# Patient Record
Sex: Female | Born: 1995 | Hispanic: Yes | Marital: Single | Smoking: Never smoker
Health system: Southern US, Community
[De-identification: ages and names within clinical notes are randomized; demographics above are authoritative.]

## PROBLEM LIST (undated history)

## (undated) DIAGNOSIS — J45909 Unspecified asthma, uncomplicated: Secondary | ICD-10-CM

## (undated) HISTORY — PX: WISDOM TOOTH EXTRACTION: SHX21

## (undated) HISTORY — PX: TONSILLECTOMY: SUR1361

---

## 2016-12-30 ENCOUNTER — Emergency Department (HOSPITAL_COMMUNITY): Payer: Federal, State, Local not specified - PPO

## 2016-12-30 ENCOUNTER — Encounter (HOSPITAL_COMMUNITY): Payer: Self-pay | Admitting: Emergency Medicine

## 2016-12-30 DIAGNOSIS — R079 Chest pain, unspecified: Secondary | ICD-10-CM | POA: Diagnosis present

## 2016-12-30 DIAGNOSIS — R0789 Other chest pain: Secondary | ICD-10-CM | POA: Insufficient documentation

## 2016-12-30 DIAGNOSIS — J45909 Unspecified asthma, uncomplicated: Secondary | ICD-10-CM | POA: Diagnosis not present

## 2016-12-30 DIAGNOSIS — R0602 Shortness of breath: Secondary | ICD-10-CM | POA: Diagnosis not present

## 2016-12-30 DIAGNOSIS — R0600 Dyspnea, unspecified: Secondary | ICD-10-CM | POA: Insufficient documentation

## 2016-12-30 LAB — D-DIMER, QUANTITATIVE (NOT AT ARMC)

## 2016-12-30 LAB — BASIC METABOLIC PANEL
ANION GAP: 11 (ref 5–15)
BUN: 12 mg/dL (ref 6–20)
CALCIUM: 9.3 mg/dL (ref 8.9–10.3)
CHLORIDE: 105 mmol/L (ref 101–111)
CO2: 23 mmol/L (ref 22–32)
CREATININE: 0.71 mg/dL (ref 0.44–1.00)
GFR calc Af Amer: >60 mL/min (ref >60)
GFR calc non Af Amer: >60 mL/min (ref >60)
Glucose, Bld: 93 mg/dL (ref 65–99)
Potassium: 3.3 mmol/L — ABNORMAL LOW (ref 3.5–5.1)
SODIUM: 139 mmol/L (ref 135–145)

## 2016-12-30 LAB — CBC
HCT: 35.5 % — ABNORMAL LOW (ref 36.0–46.0)
HEMOGLOBIN: 11.6 g/dL — AB (ref 12.0–15.0)
MCH: 28 pg (ref 26.0–34.0)
MCHC: 32.7 g/dL (ref 30.0–36.0)
MCV: 85.7 fL (ref 78.0–100.0)
PLATELETS: 307 10*3/uL (ref 150–400)
RBC: 4.14 MIL/uL (ref 3.87–5.11)
RDW: 14.1 % (ref 11.5–15.5)
WBC: 8.4 10*3/uL (ref 4.0–10.5)

## 2016-12-30 LAB — I-STAT TROPONIN, ED
TROPONIN I, POC: 0 ng/mL (ref 0.00–0.08)
Troponin i, poc: 0.01 ng/mL (ref 0.00–0.08)

## 2016-12-30 NOTE — ED Triage Notes (Signed)
Pt presents with midsternal CP x 2 days worse with activity; pt reports she is an active person who runs and works out daily however she cannot climb one flight of stairs d/t pain and sob; pt denies any hx of same

## 2016-12-31 ENCOUNTER — Emergency Department (HOSPITAL_COMMUNITY)
Admission: EM | Admit: 2016-12-31 | Discharge: 2016-12-31 | Disposition: A | Discharge status: Home / Self Care | Payer: Federal, State, Local not specified - PPO | Attending: Emergency Medicine | Admitting: Emergency Medicine

## 2016-12-31 DIAGNOSIS — R0600 Dyspnea, unspecified: Secondary | ICD-10-CM

## 2016-12-31 DIAGNOSIS — R079 Chest pain, unspecified: Secondary | ICD-10-CM

## 2016-12-31 HISTORY — DX: Unspecified asthma, uncomplicated: J45.909

## 2016-12-31 NOTE — ED Provider Notes (Signed)
[facility]-EMERGENCY DEPT Provider Note   CSN: [id number] Arrival date & time: 12/30/16  1907   By signing my name below, I, Clarisse GougeXavier Herndon, attest that this documentation has been prepared under the direction and in the presence of Azalia Bilisampos, Elfriede Bonini, MD. Electronically signed, Clarisse GougeXavier Herndon, ED Scribe. 12/31/16. 2:52 AM.   History   Chief Complaint Chief Complaint  Patient presents with  . Chest Pain  . Shortness of Breath   The history is provided by the patient and medical records. No language interpreter was used.    Julie Wilkins is a 21 y.o. female h/o asthma on record presenting to the Emergency Department concerning fluctuating chest pain x 4 days. Pt states 4 days ago she began to experience chest pain that she describes as "a hiccup that wont come out", stating she had to take deep breaths in order to soothe this sensation. The following day, the pt worked 15 hours and experienced very mild SOB intermittently, but felt fine otherwise. Yesterday, pt allegedly experienced gradually worsening chest pain throughout the day and SOB on exertion. She states she became winded walking up a flight of stairs, which is atypical for her because she is generally pretty active. She adds she moved from a 2nd floor apartment to a 4th floor apartment yesterday. She states she uses birth control. No h/o similar symptoms noted. No ETOH use. No Hx or FMHx of blood clots. No leg swelling or long distance travel noted.  Past Medical History:  Diagnosis Date  . Asthma     There are no active problems to display for this patient.   Past Surgical History:  Procedure Laterality Date  . TONSILLECTOMY    . WISDOM TOOTH EXTRACTION      OB History    No data available       Home Medications    Prior to Admission medications   Not on File    Family History History reviewed. No pertinent family history.  Social History Social History  Substance Use Topics  . Smoking status: Not on file  .  Smokeless tobacco: Never Used  . Alcohol use No     Allergies   Amoxicillin   Review of Systems Review of Systems  Constitutional: Negative for activity change and appetite change.  Respiratory: Positive for shortness of breath.   Cardiovascular: Positive for chest pain. Negative for leg swelling.  All other systems reviewed and are negative.    Physical Exam Updated Vital Signs BP 127/86   Pulse (!) 54   Temp 98.6 F (37 C) (Oral)   Resp 20   Ht 5\' 5"  (1.651 m)   Wt 130 lb (59 kg)   LMP 12/05/2016   SpO2 100%   BMI 21.63 kg/m   Physical Exam  Constitutional: She is oriented to person, place, and time. She appears well-developed and well-nourished. No distress.  HENT:  Head: Normocephalic and atraumatic.  Eyes: EOM are normal.  Neck: Normal range of motion.  Cardiovascular: Normal rate, regular rhythm and normal heart sounds.   Pulmonary/Chest: Effort normal and breath sounds normal.  Abdominal: Soft. She exhibits no distension. There is no tenderness.  Musculoskeletal: Normal range of motion. She exhibits no tenderness.  No calf tenderness  Neurological: She is alert and oriented to person, place, and time.  Skin: Skin is warm and dry.  Psychiatric: She has a normal mood and affect. Judgment normal.  Nursing note and vitals reviewed.    ED Treatments / Results  DIAGNOSTIC STUDIES: Oxygen  Saturation is 100% on RA, NL by my interpretation.    COORDINATION OF CARE: 2:48 AM-Discussed next steps with pt. Pt verbalized understanding and is agreeable with the plan. Will Rx medication and refer to cardiologist. Pt prepared for d/c, advised of symptomatic care at home, F/U instructions  and return precautions.    Labs (all labs ordered are listed, but only abnormal results are displayed) Labs Reviewed  BASIC METABOLIC PANEL - Abnormal; Notable for the following:       Result Value   Potassium 3.3 (*)    All other components within normal limits  CBC - Abnormal;  Notable for the following:    Hemoglobin 11.6 (*)    HCT 35.5 (*)    All other components within normal limits  D-DIMER, QUANTITATIVE (NOT AT [facility])  I-STAT TROPOININ, ED  I-STAT TROPOININ, ED    EKG  EKG Interpretation  Date/Time:  Friday December 30 2016 19:19:15 EDT Ventricular Rate:  90 PR Interval:  164 QRS Duration: 98 QT Interval:  382 QTC Calculation: 467 R Axis:   70 Text Interpretation:  Normal sinus rhythm with sinus arrhythmia Incomplete right bundle branch block Borderline ECG No old tracing to compare Confirmed by Azalia Bilis ([id number]) on 12/31/2016 2:41:50 AM       Radiology Dg Chest 2 View  Result Date: 12/30/2016 CLINICAL DATA:  Chest pain since 12/28/2016, worse today. EXAM: CHEST  2 VIEW COMPARISON:  None. FINDINGS: Lungs are clear. Heart size is normal. No pneumothorax or pleural effusion. No bony abnormality. IMPRESSION: Negative chest. Electronically Signed   By: Drusilla Kanner M.D.   On: 12/30/2016 19:50    Procedures Procedures (including critical care time)  Medications Ordered in ED Medications - No data to display   Initial Impression / Assessment and Plan / ED Course  I have reviewed the triage vital signs and the nursing notes.  Pertinent labs & imaging results that were available during my care of the patient were reviewed by me and considered in my medical decision making (see chart for details).     Patient overall well-appearing.  No increased work of breathing.  I personally walked around the emergency department and do breast pain.  She had no increased work of breathing.  Her heart rate remained normal.  She had no hypoxia.  Bowel signs stable.  D-dimer negative.  Hemoglobin stable.  Chest x-ray clear.  Outpatient pulmonary follow-up.  Patient understands return to the ER for new or worsening symptoms  Final Clinical Impressions(s) / ED Diagnoses   Final diagnoses:  Chest pain, unspecified type  Dyspnea, unspecified type    New  Prescriptions There are no discharge medications for this patient.  I personally performed the services described in this documentation, which was scribed in my presence. The recorded information has been reviewed and is accurate.        Azalia Bilis, MD 12/31/16 [phone]

## 2017-01-04 ENCOUNTER — Encounter: Payer: Self-pay | Admitting: Cardiology

## 2017-01-04 ENCOUNTER — Ambulatory Visit (INDEPENDENT_AMBULATORY_CARE_PROVIDER_SITE_OTHER): Payer: Federal, State, Local not specified - PPO | Admitting: Cardiology

## 2017-01-04 DIAGNOSIS — R0602 Shortness of breath: Secondary | ICD-10-CM

## 2017-01-04 DIAGNOSIS — R079 Chest pain, unspecified: Secondary | ICD-10-CM

## 2017-01-04 DIAGNOSIS — R002 Palpitations: Secondary | ICD-10-CM

## 2017-01-04 NOTE — Patient Instructions (Signed)
Medication Instructions:  Your physician recommends that you continue on your current medications as directed. Please refer to the Current Medication list given to you today.   Labwork: TODAY: BMET  Testing/Procedures: Your physician has requested that you have an echocardiogram. Echocardiography is a painless test that uses sound waves to create images of your heart. It provides your doctor with information about the size and shape of your heart and how well your heart's chambers and valves are working. This procedure takes approximately one hour. There are no restrictions for this procedure.   Your physician has requested that you have an exercise tolerance test. For further information please visit [email or web address]. Please also follow instruction sheet, as given.  Your physician has recommended that you wear an event monitor. Event monitors are medical devices that record the heart's electrical activity. Doctors most often us these monitors to diagnose arrhythmias. Arrhythmias are problems with the speed or rhythm of the heartbeat. The monitor is a small, portable device. You can wear one while you do your normal daily activities. This is usually used to diagnose what is causing palpitations/syncope (passing out).  Follow-Up: Your physician recommends that you schedule a follow-up appointment AS NEEDED with Dr. Mayford Knifeurner pending study results.  Any Other Special Instructions Will Be Listed Below (If Applicable).     If you need a refill on your cardiac medications before your next appointment, please call your pharmacy.

## 2017-01-04 NOTE — Progress Notes (Signed)
Cardiology Office Note    Date:  01/04/2017   ID:  Julie Wilkins, DOB 31-May-1996, MRN [id number]  PCP:  System, Pcp Not In  Cardiologist:  Armanda Magic, MD   Chief Complaint  Patient presents with  . Chest Pain  . Shortness of Breath    History of Present Illness:  Julie Wilkins is a 21 y.o. female who is being seen today for the evaluation of chest pain and SOB at the request of Azalia Bilis, MD.  This is a 21yo female with a history of asthma who was seen in the ER a few days ago complaining of CP and SOB.  She had been having intermittent CP for 4 days prior.  She says that she had worked out that day and later had some dizziness and a  Headache.  The next day during her workout she had chest discomfort and resolved after her workout was over.  She described it as a "hiccup" that would not come out and when she took deep breaths it made it better. She thinks it may have been palpitations.   The day after the chest discomfort started she had  SOB off and on going up and down the stairs.  The next day her chest discomfort worsened as well as the SOB and would get winded just going up 1 flight of stairs.  She is not sure if her discomfort is more the heart fluttering or a pain.  She denied any recent long distance travel.  Workup included a K+ of 3.3, Hgb 11.6 and EKG with IRBBB.  D-Dimer was negative and chest xray was normal.  She has not done any activity since Friday. She says that when she was in the ER she was noted to have the same feeling in her Chest and PVCs were noted on the tele.    Past Medical History:  Diagnosis Date  . Asthma     Past Surgical History:  Procedure Laterality Date  . TONSILLECTOMY    . WISDOM TOOTH EXTRACTION      Current Medications: Current Meds  Medication Sig  . TRINESSA, 28, 0.18/0.215/0.25 MG-35 MCG tablet Take by mouth daily.    Allergies:   Amoxicillin   Social History   Social History  . Marital status: Single    Spouse name: N/A    . Number of children: N/A  . Years of education: N/A   Social History Main Topics  . Smoking status: Never Smoker  . Smokeless tobacco: Never Used  . Alcohol use No  . Drug use: No  . Sexual activity: Not Asked   Other Topics Concern  . None   Social History Narrative  . None     Family History:  The patient's family history includes Diabetes in her maternal grandmother and maternal uncle.   ROS:   Please see the history of present illness.    ROS All other systems reviewed and are negative.  No flowsheet data found.     PHYSICAL EXAM:   VS:  BP 100/70   Pulse 64   Ht 5\' 5"  (1.651 m)   Wt 136 lb (61.7 kg)   LMP 12/05/2016   SpO2 98%   BMI 22.63 kg/m    GEN: Well nourished, well developed, in no acute distress  HEENT: normal  Neck: no JVD, carotid bruits, or masses Cardiac: RRR; no murmurs, rubs, or gallops,no edema.  Intact distal pulses bilaterally.  Respiratory:  clear to auscultation bilaterally, normal work of  breathing GI: soft, nontender, nondistended, + BS MS: no deformity or atrophy  Skin: warm and dry, no rash Neuro:  Alert and Oriented x 3, Strength and sensation are intact Psych: euthymic mood, full affect  Wt Readings from Last 3 Encounters:  01/04/17 136 lb (61.7 kg)  12/30/16 130 lb (59 kg)      Studies/Labs Reviewed:   EKG:  EKG is not ordered today.  The ekg ordered in EF demonstrates   Recent Labs: 12/30/2016: BUN 12; Creatinine, Ser 0.71; Hemoglobin 11.6; Platelets 307; Potassium 3.3; Sodium 139   Lipid Panel No results found for: CHOL, TRIG, HDL, CHOLHDL, VLDL, LDLCALC, LDLDIRECT  Additional studies/ records that were reviewed today include:  ER records    ASSESSMENT:    1. Chest pain, unspecified type   2. SOB (shortness of breath)   3. Heart palpitations      PLAN:  In order of problems listed above:  1. Chest pain - this is atypical and may be related to asthma.  She does not have any CRFs and very young so  coronary ischemia unlikely ulness she has an anomalous coronary artery.  I will get an ETT to rule out ischemia.   2.  SOB with a history of asthma.  This is likely related to her underlying asthma but she has an IRBBB noted on EKG that can be seen with ASD.  I do not hear a murmur.  I will get a 2D echo to rule out structural heart disease. Chest xray and D-Dimer were negative.  If stress test and echo are normal she needs to see Pulmonary.   3.  ? Palpitations - some of her symptoms sound like she may be having symptomatic PVCs. Her potassium was low at the time at 3.3.  She did not get any potassium in the ER so I will check another BMET today    Medication Adjustments/Labs and Tests Ordered: Current medicines are reviewed at length with the patient today.  Concerns regarding medicines are outlined above.  Medication changes, Labs and Tests ordered today are listed in the Patient Instructions below.  There are no Patient Instructions on file for this visit.   Signed, Armanda Magicraci Kevonta Phariss, MD  01/04/2017 2:08 PM    [facility] [address], [place], [place]  [address] Phone: [phone]; Fax: [phone]

## 2017-01-05 LAB — BASIC METABOLIC PANEL
BUN / CREAT RATIO: 18 (ref 9–23)
BUN: 13 mg/dL (ref 6–20)
CHLORIDE: 103 mmol/L (ref 96–106)
CO2: 23 mmol/L (ref 20–29)
CREATININE: 0.71 mg/dL (ref 0.57–1.00)
Calcium: 9.6 mg/dL (ref 8.7–10.2)
GFR calc Af Amer: 139 mL/min/{1.73_m2} (ref >59)
GFR calc non Af Amer: 120 mL/min/{1.73_m2} (ref >59)
GLUCOSE: 98 mg/dL (ref 65–99)
POTASSIUM: 4 mmol/L (ref 3.5–5.2)
SODIUM: 141 mmol/L (ref 134–144)

## 2017-01-24 ENCOUNTER — Other Ambulatory Visit (HOSPITAL_COMMUNITY): Payer: Federal, State, Local not specified - PPO

## 2018-03-31 IMAGING — DX DG CHEST 2V
2 series · 2 of 2 positions shown · non-contrast
Comparison: None.

CLINICAL DATA: Chest pain since 12/28/2016, worse today.

EXAM:
CHEST  2 VIEW

[chest pa]
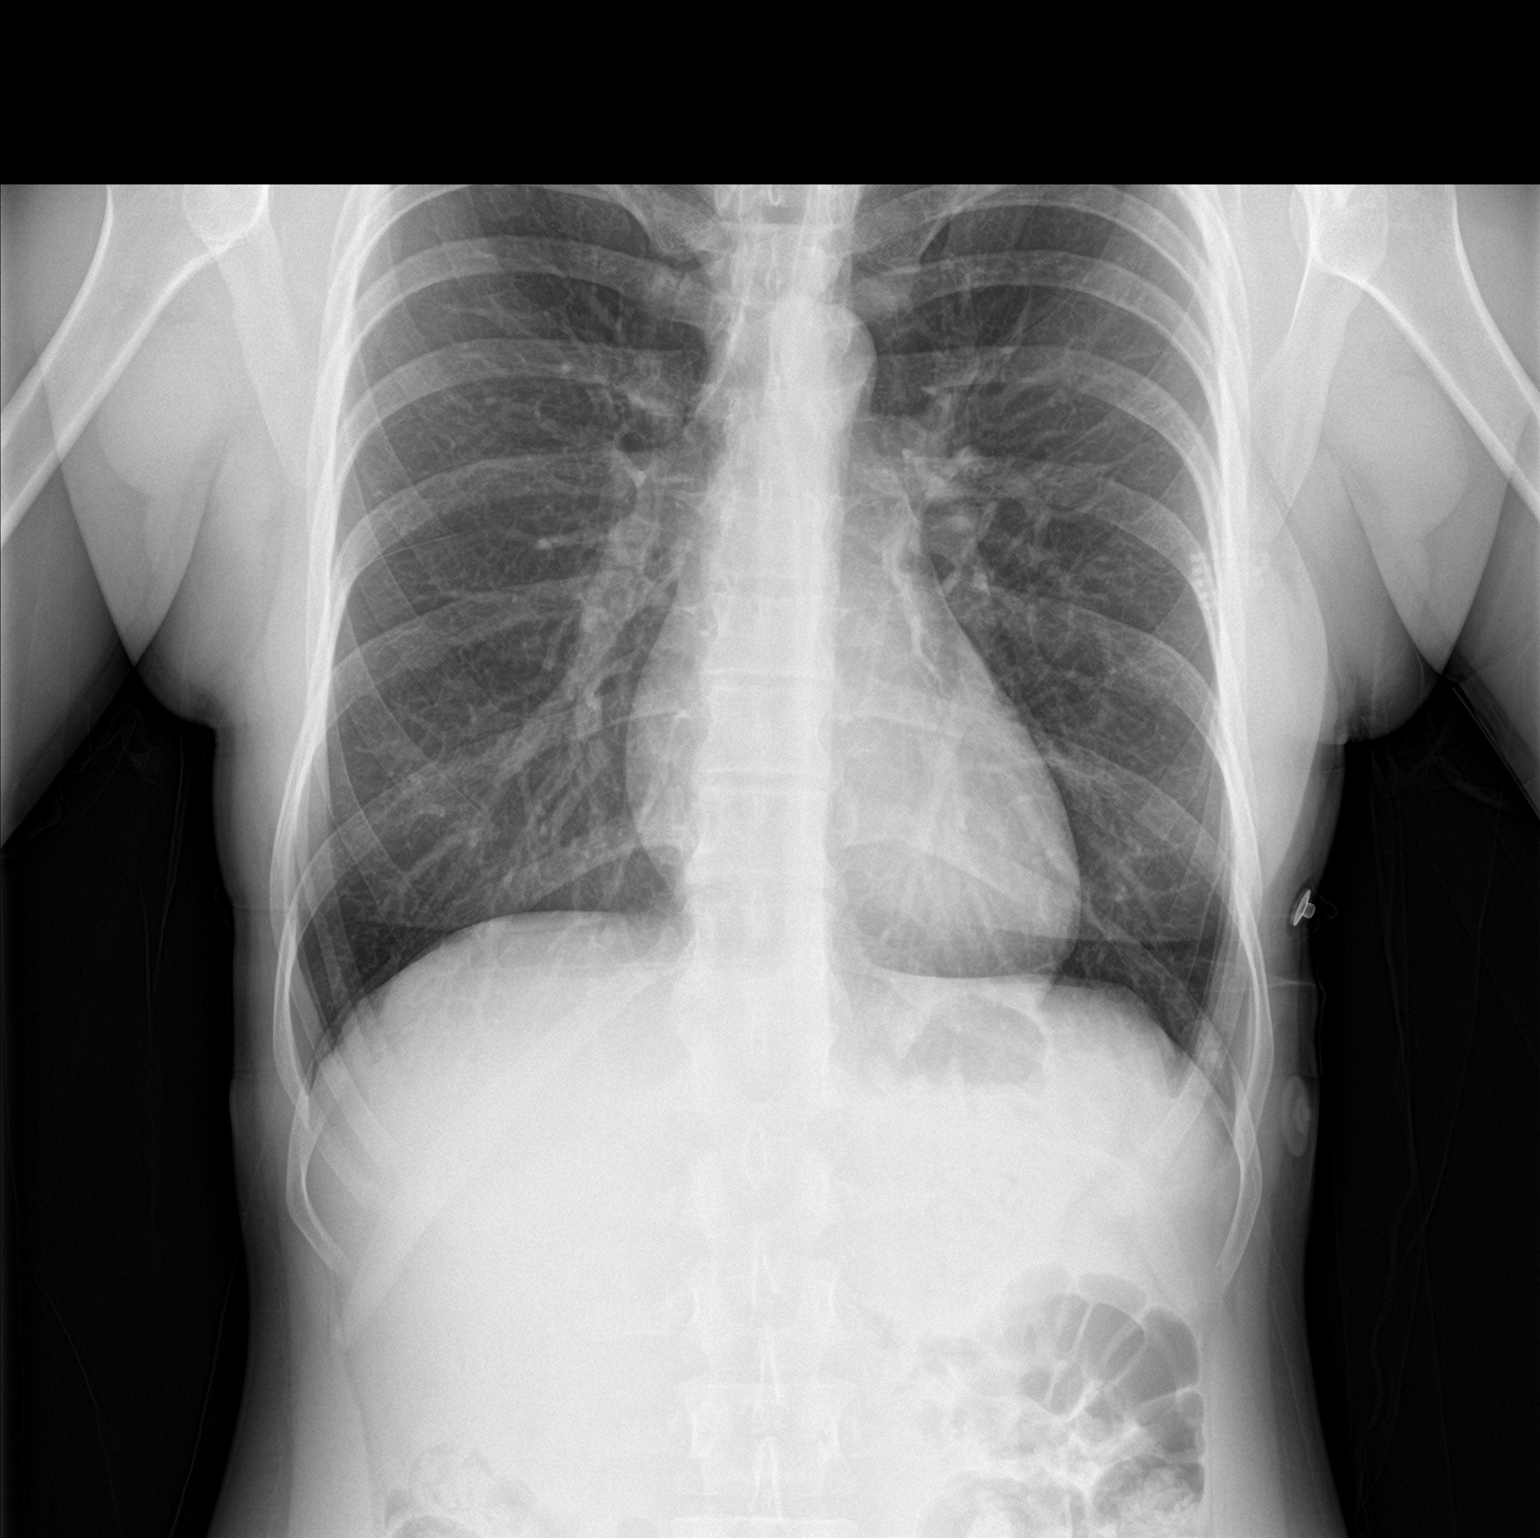

[chest lat]
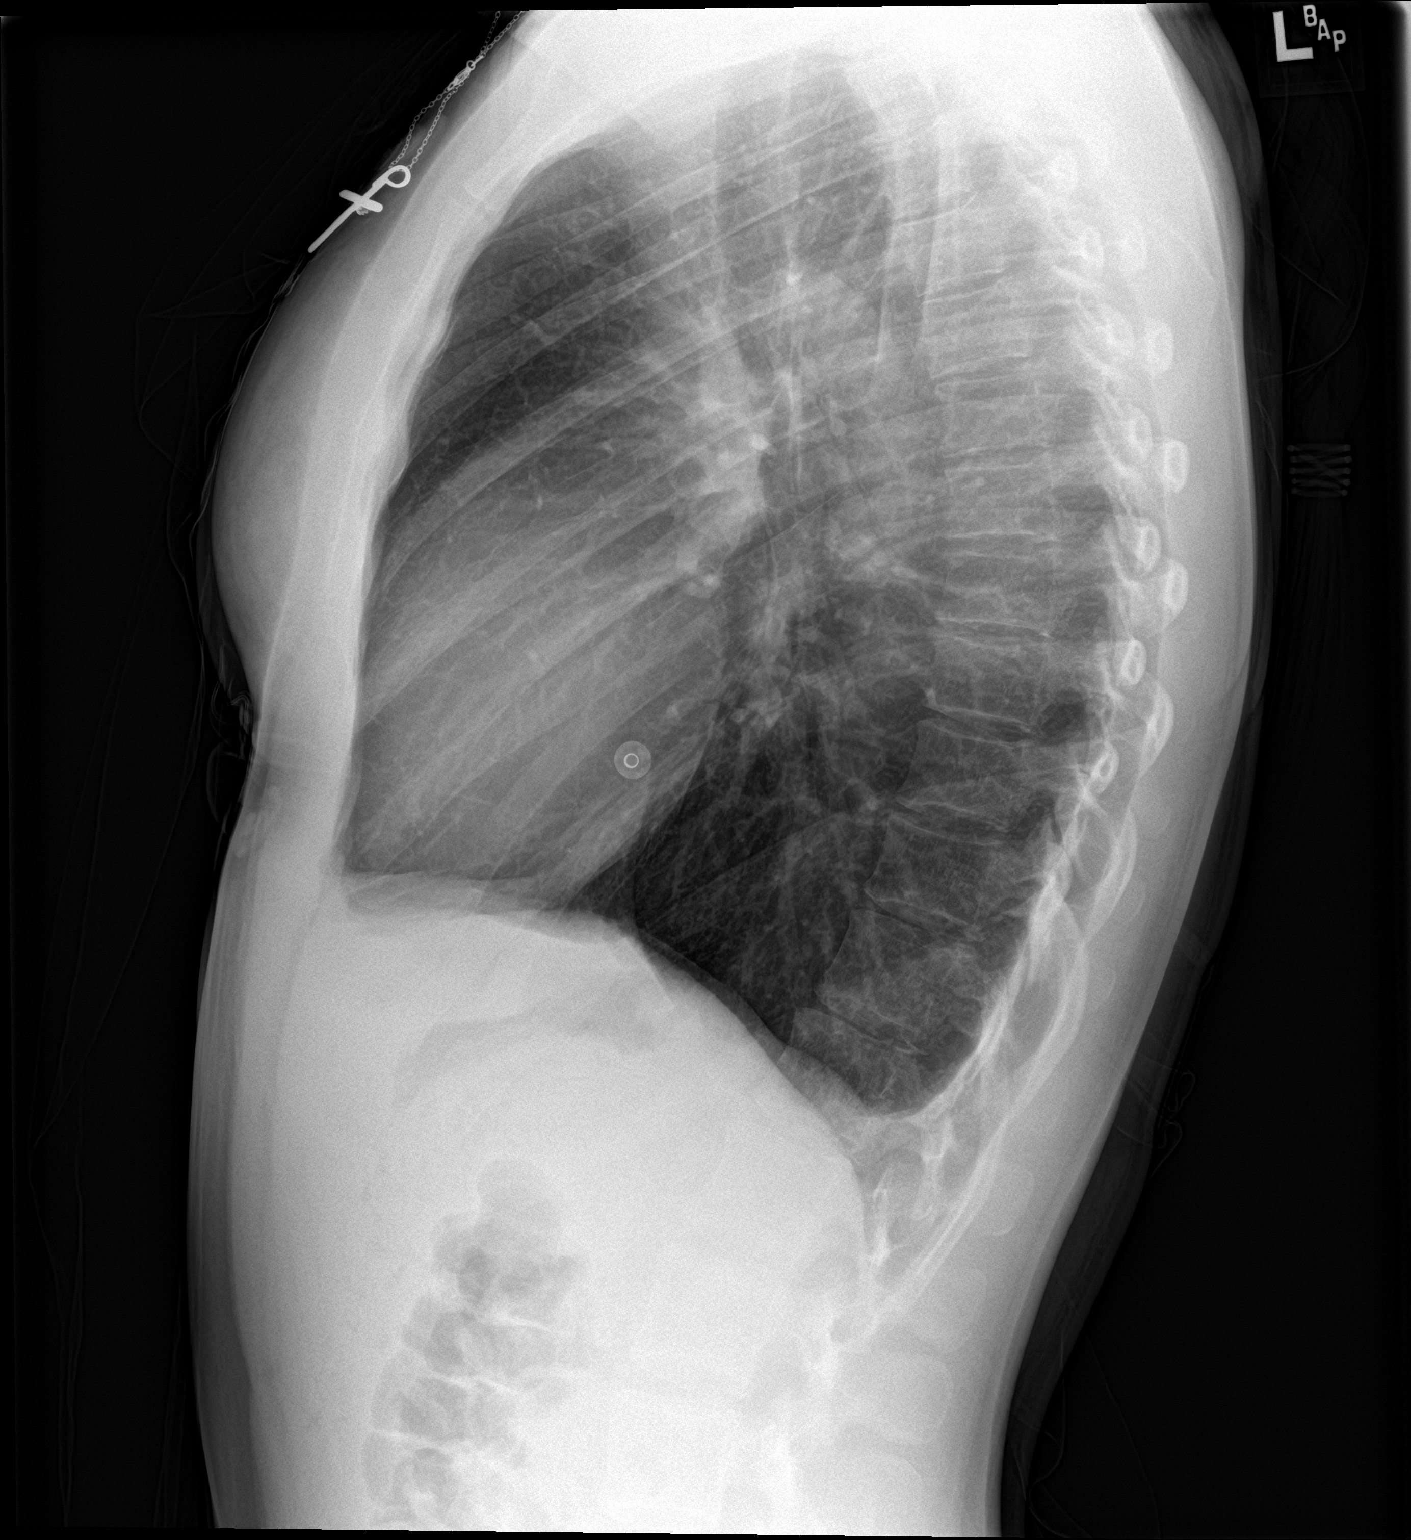

[2 of 2 positions shown; findings below may reference images not displayed]

FINDINGS: Lungs are clear. Heart size is normal. No pneumothorax or pleural
effusion. No bony abnormality.
IMPRESSION: Negative chest.
